# Patient Record
Sex: Female | Born: 2000 | Race: White | Hispanic: No | Marital: Single | Smoking: Never smoker
Health system: Southern US, Community
[De-identification: ages and names within clinical notes are randomized; demographics above are authoritative.]

---
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
## 2021-07-10 ENCOUNTER — Ambulatory Visit
Admission: EM | Admit: 2021-07-10 | Discharge: 2021-07-10 | Disposition: A | Discharge status: Home / Self Care | Payer: BC Managed Care – PPO | Attending: Emergency Medicine | Admitting: Emergency Medicine

## 2021-07-10 ENCOUNTER — Encounter: Payer: Self-pay | Admitting: Emergency Medicine

## 2021-07-10 ENCOUNTER — Ambulatory Visit (INDEPENDENT_AMBULATORY_CARE_PROVIDER_SITE_OTHER): Payer: BC Managed Care – PPO

## 2021-07-10 DIAGNOSIS — S93401A Sprain of unspecified ligament of right ankle, initial encounter: Secondary | ICD-10-CM

## 2021-07-10 DIAGNOSIS — W19XXXA Unspecified fall, initial encounter: Secondary | ICD-10-CM

## 2021-07-10 DIAGNOSIS — M25571 Pain in right ankle and joints of right foot: Secondary | ICD-10-CM | POA: Diagnosis not present

## 2021-07-10 MED ORDER — IBUPROFEN 600 MG PO TABS: 600.0000 mg | ORAL_TABLET | Freq: Four times a day (QID) | ORAL | 0 refills | Status: AC | PRN

## 2021-07-10 NOTE — ED Triage Notes (Signed)
Pt tripped and injured her right ankle on 07/07/21

## 2021-07-10 NOTE — ED Provider Notes (Signed)
HPI  SUBJECTIVE:  Kim Garza is a 21 y.o. female who presents with lateral right ankle pain, swelling, limitation of motion at the ankle after tripping and rolling her ankle outward 4 days ago.  She was unable to bear weight on it immediately after.  She reports numbness and tingling in her foot, which has resolved, and difficulty bearing weight.  She tried ice, elevation and Ace wrap with improvement in her symptoms.  She wants to rule out fracture.  She has had a minor sprain to this ankle before.  No other medical problems.  LMP: 3 weeks ago.  Denies possibility being pregnant.  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  Orthopedics: None   History reviewed. No pertinent past medical history.  History reviewed. No pertinent surgical history.  History reviewed. No pertinent family history.  Social History   Tobacco Use   Smoking status: Never   Smokeless tobacco: Never  Vaping Use   Vaping Use: Never used  Substance Use Topics   Alcohol use: Yes   Drug use: Never    No current facility-administered medications for this encounter.  Current Outpatient Medications:    ibuprofen (ADVIL) 600 MG tablet, Take 1 tablet (600 mg total) by mouth every 6 (six) hours as needed., Disp: 30 tablet, Rfl: 0   JUNEL 1/20 1-20 MG-MCG tablet, Take 1 tablet by mouth daily., Disp: , Rfl:   No Known Allergies   ROS  As noted in HPI.   Physical Exam  BP 114/75 (BP Location: Left Arm)    Pulse 80    Temp 98.7 F (37.1 C) (Oral)    Resp 16    LMP  (LMP Unknown)    SpO2 97%   Constitutional: Well developed, well nourished, no acute distress Eyes:  EOMI, conjunctiva normal bilaterally HENT: Normocephalic, atraumatic,mucus membranes moist Respiratory: Normal inspiratory effort Cardiovascular: Normal rate GI: nondistended skin: No rash, skin intact Musculoskeletal: Right ankle: Soft tissue swelling laterally, tenderness along distal fibula, ATFL, calcaneofibular ligament. Proximal fibula NT  ,  Medial malleolus NT ,  Deltoid ligaments medially NT ,   posterior tablofibular ligament NT ,  Achilles NT, calcaneus NT,  Proximal 5th metatarsal NT, Midfoot NT, distal NVI with baseline sensation / motor to foot with DP 2+. no pain with dorsiflexion/plantar flexion. Pain with inversion/eversion. +  bruising. - squeeze test.  Ant drawer test stable. Pt has difficulty bearing weight in dept.   Neurologic: Alert & oriented x 3, no focal neuro deficits Psychiatric: Speech and behavior appropriate   ED Course   Medications - No data to display  Orders Placed This Encounter  Procedures   DG Ankle Complete Right    Standing Status:   Standing    Number of Occurrences:   1    Order Specific Question:   Reason for Exam (SYMPTOM  OR DIAGNOSIS REQUIRED)    Answer:   Fall    Order Specific Question:   Is patient pregnant?    Answer:   No    No results found for this or any previous visit (from the past 24 hour(s)). DG Ankle Complete Right  Result Date: 07/10/2021 CLINICAL DATA:  Right ankle pain for 3 days.  Status post fall. EXAM: RIGHT ANKLE - COMPLETE 3+ VIEW COMPARISON:  None. FINDINGS: There is no evidence of fracture, dislocation, or joint effusion. There is no evidence of arthropathy or other focal bone abnormality. Soft tissues are unremarkable. IMPRESSION: Negative. Electronically Signed   By: Elige Ko  M.D.   On: 07/10/2021 09:22    ED Clinical Impression  1. Sprain of right ankle, unspecified ligament, initial encounter      ED Assessment/Plan  Patient does not meet [place] ankle rules due to distal fibular bony tenderness.  X-ray ankle to rule out fracture.  If negative, home with ASO, ice, Tylenol/ibuprofen, follow-up with [facility] clinic orthopedic sports medicine or [facility] sports medicine if not getting any better for reevaluation and physical therapy in a week to 10 days.  Reviewed imaging independently.  Normal ankle x-ray.  See radiology report for full  details.  Discussed  imaging, MDM, treatment plan, and plan for follow-up with patient. patient agrees with plan.   Meds ordered this encounter  Medications   ibuprofen (ADVIL) 600 MG tablet    Sig: Take 1 tablet (600 mg total) by mouth every 6 (six) hours as needed.    Dispense:  30 tablet    Refill:  0      *This clinic note was created using Scientist, clinical (histocompatibility and immunogenetics). Therefore, there may be occasional mistakes despite careful proofreading.  ?    Domenick Gong, MD 07/10/21 [phone]

## 2021-07-10 NOTE — Discharge Instructions (Addendum)
Your ankle x-ray was negative for fracture today.  Wear the ASO for the next several weeks at all times, then as needed for comfort.  Keep it, in case you sprained your ankle again.  Continue ice, elevation, take 600 mg of ibuprofen, 1000 mg of Tylenol together 3-4 times a day as needed for pain.  Please follow-up with sports medicine at the [place] clinic or with [facility] if you are not getting any better for reevaluation and possible physical therapy in a week or 2.

## 2023-01-22 IMAGING — DX DG ANKLE COMPLETE 3+V*R*
3 series · 3 of 3 positions shown · non-contrast
Comparison: None.

CLINICAL DATA: Right ankle pain for 3 days.  Status post fall.

EXAM:
RIGHT ANKLE - COMPLETE 3+ VIEW

[ankle ap]
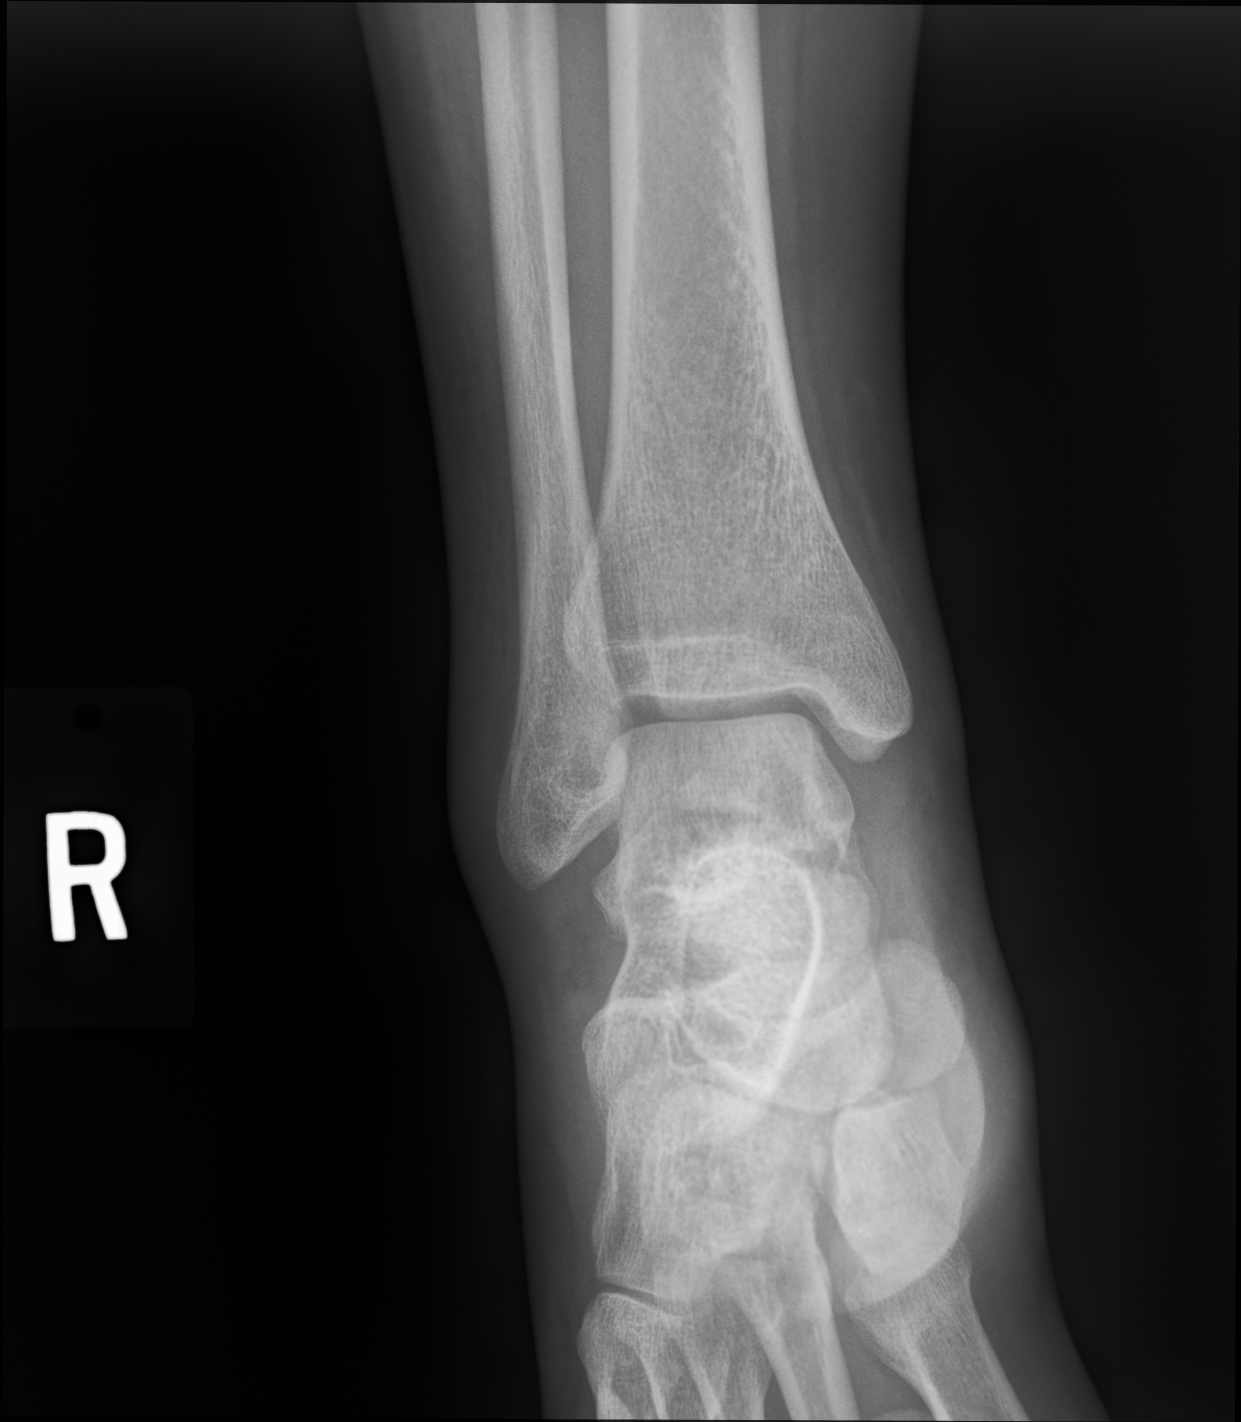

[ankle mlo]
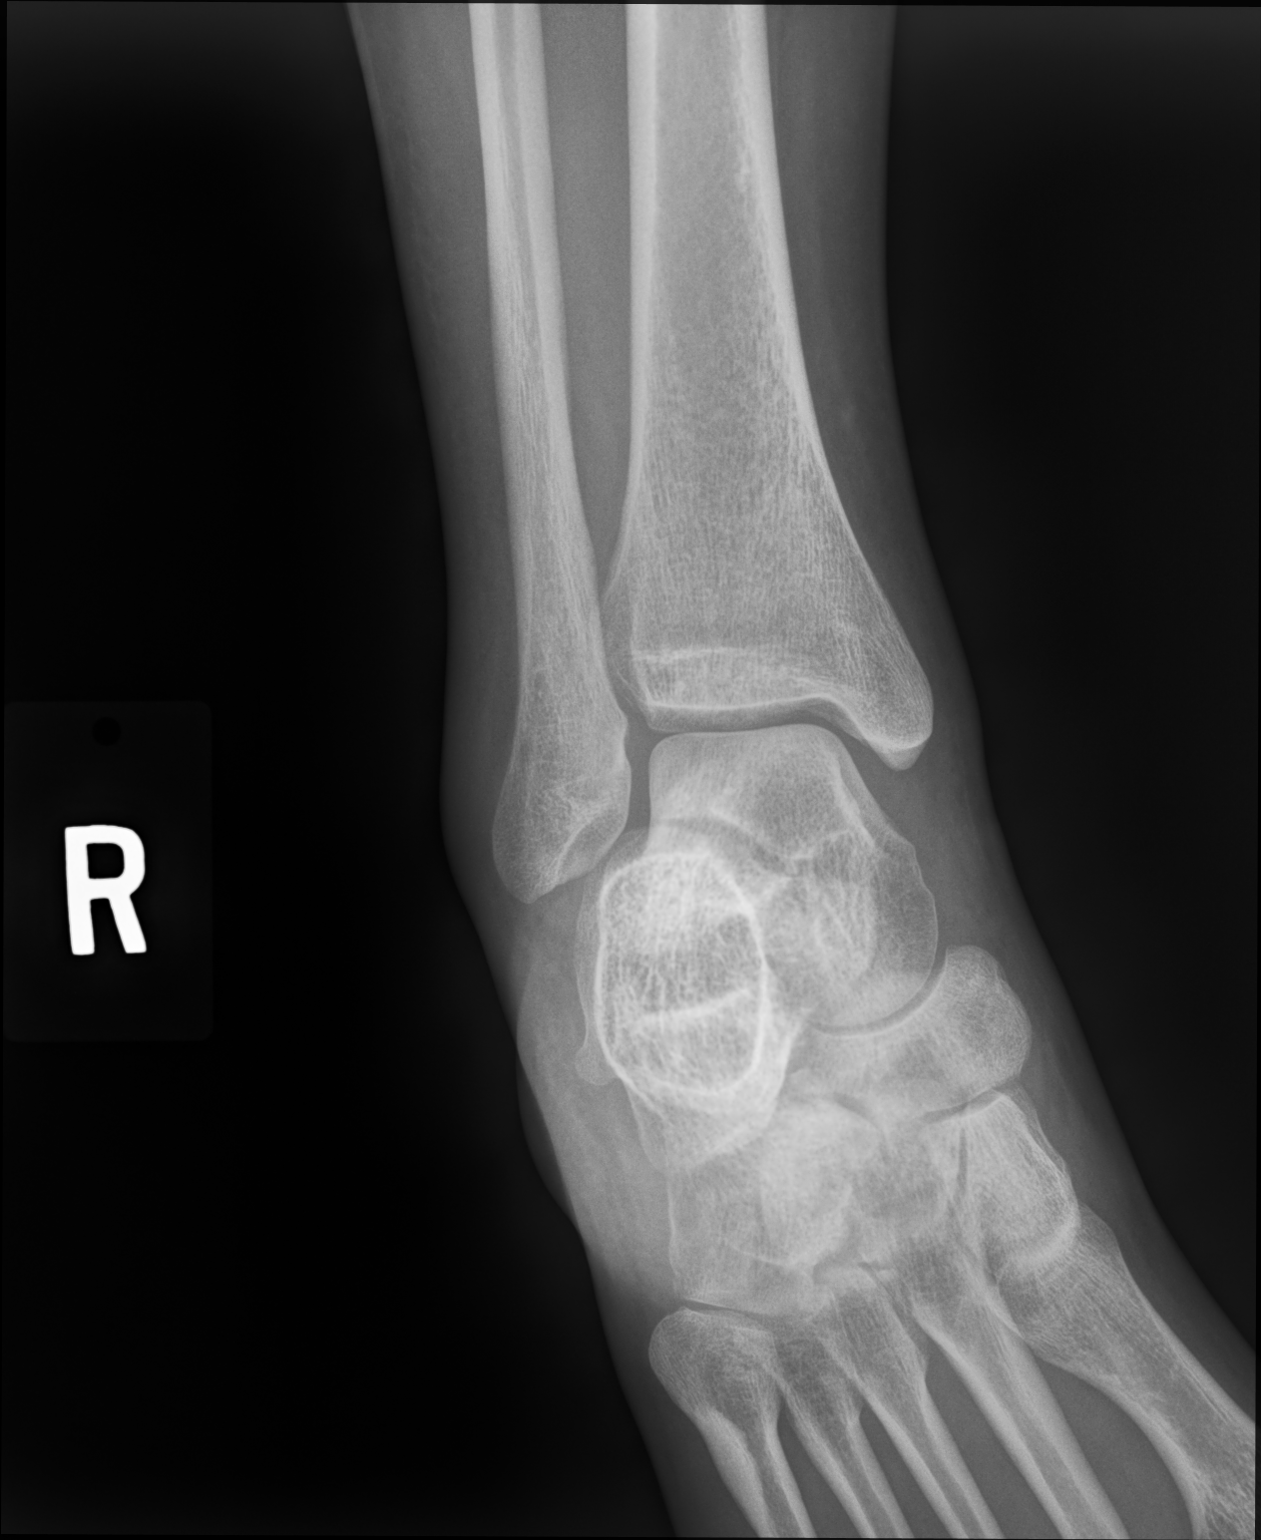

[ankle lat]
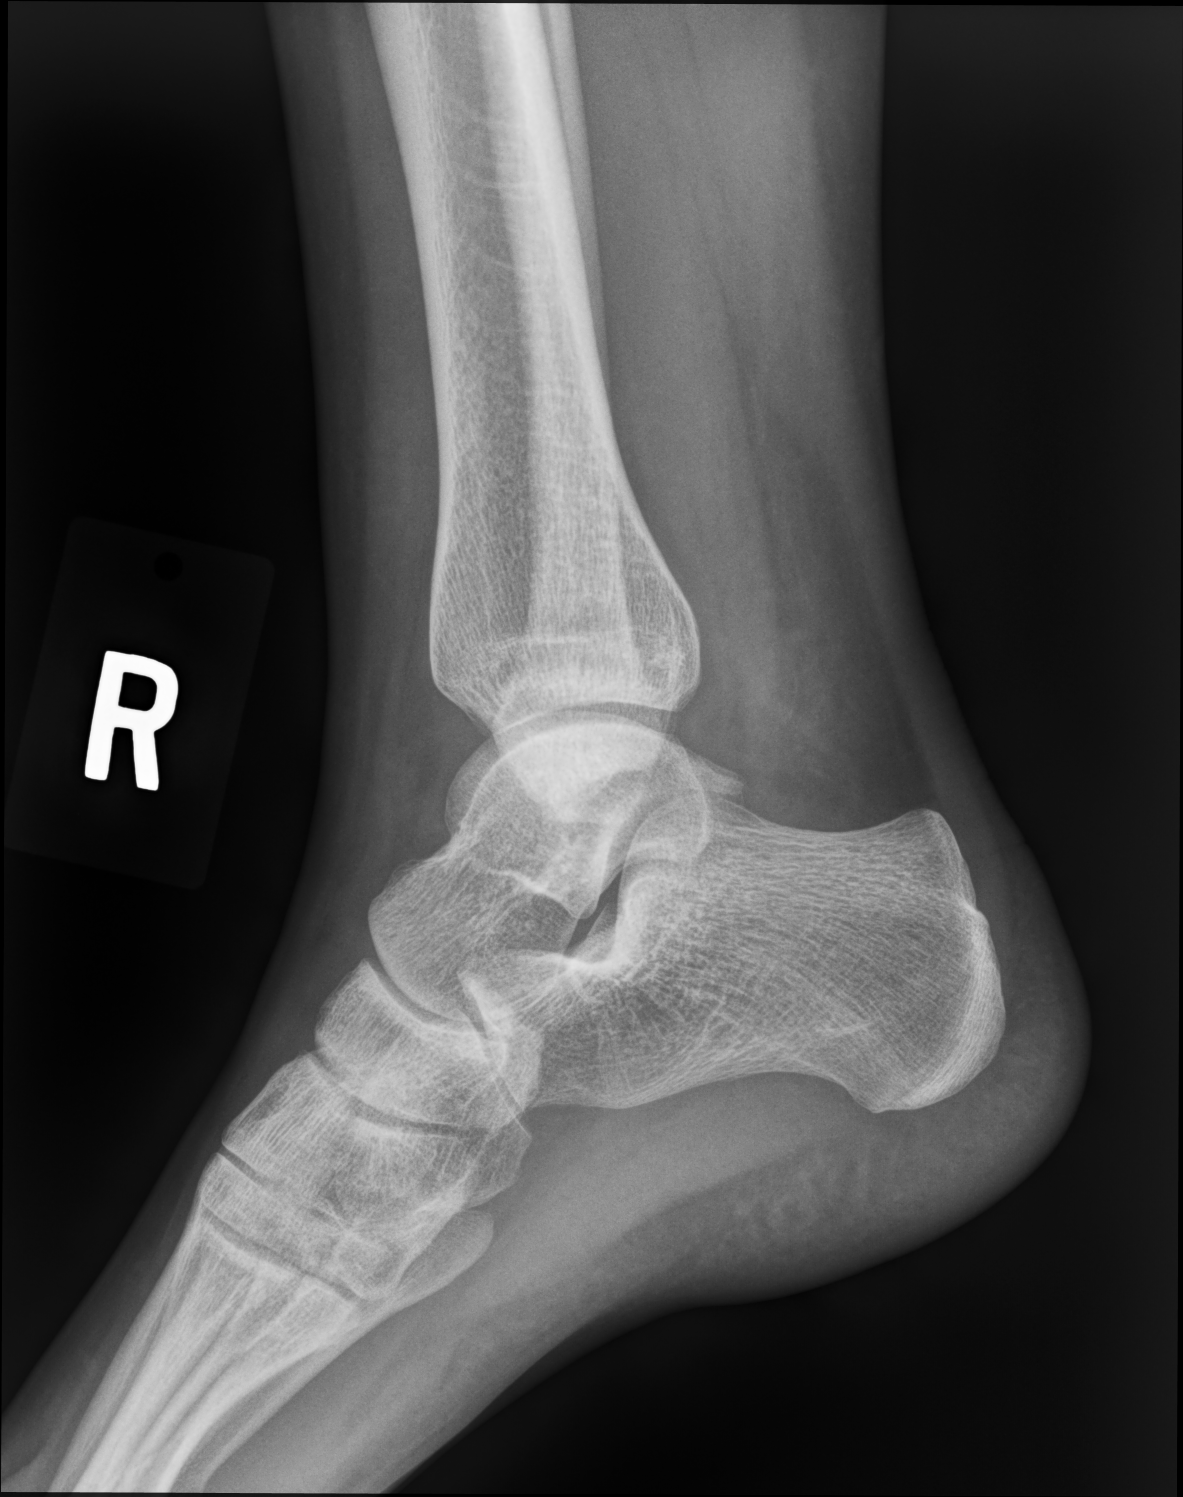

[3 of 3 positions shown; findings below may reference images not displayed]

FINDINGS: There is no evidence of fracture, dislocation, or joint effusion.
There is no evidence of arthropathy or other focal bone abnormality.
Soft tissues are unremarkable.
IMPRESSION: Negative.
